# Patient Record
Sex: Female | Born: 1985 | Race: White | Hispanic: No | Marital: Married | Smoking: Never smoker
Health system: Southern US, Community
[De-identification: ages and names within clinical notes are randomized; demographics above are authoritative.]

---

## 2010-05-04 ENCOUNTER — Ambulatory Visit: Payer: Self-pay | Admitting: Internal Medicine

## 2016-08-01 ENCOUNTER — Encounter (HOSPITAL_COMMUNITY): Payer: Self-pay | Admitting: Emergency Medicine

## 2016-08-01 ENCOUNTER — Emergency Department (HOSPITAL_COMMUNITY): Payer: PRIVATE HEALTH INSURANCE

## 2016-08-01 DIAGNOSIS — R0602 Shortness of breath: Secondary | ICD-10-CM | POA: Diagnosis present

## 2016-08-01 DIAGNOSIS — Z79899 Other long term (current) drug therapy: Secondary | ICD-10-CM | POA: Insufficient documentation

## 2016-08-01 DIAGNOSIS — R0600 Dyspnea, unspecified: Secondary | ICD-10-CM | POA: Diagnosis not present

## 2016-08-01 DIAGNOSIS — R079 Chest pain, unspecified: Secondary | ICD-10-CM | POA: Diagnosis not present

## 2016-08-01 LAB — BASIC METABOLIC PANEL
ANION GAP: 10 (ref 5–15)
BUN: 9 mg/dL (ref 6–20)
CHLORIDE: 101 mmol/L (ref 101–111)
CO2: 23 mmol/L (ref 22–32)
Calcium: 9.4 mg/dL (ref 8.9–10.3)
Creatinine, Ser: 0.57 mg/dL (ref 0.44–1.00)
GFR calc Af Amer: >60 mL/min (ref >60)
GFR calc non Af Amer: >60 mL/min (ref >60)
GLUCOSE: 107 mg/dL — AB (ref 65–99)
POTASSIUM: 3.9 mmol/L (ref 3.5–5.1)
Sodium: 134 mmol/L — ABNORMAL LOW (ref 135–145)

## 2016-08-01 LAB — I-STAT TROPONIN, ED: Troponin i, poc: 0.01 ng/mL (ref 0.00–0.08)

## 2016-08-01 LAB — CBC
HEMATOCRIT: 35 % — AB (ref 36.0–46.0)
Hemoglobin: 11.5 g/dL — ABNORMAL LOW (ref 12.0–15.0)
MCH: 28.3 pg (ref 26.0–34.0)
MCHC: 32.9 g/dL (ref 30.0–36.0)
MCV: 86 fL (ref 78.0–100.0)
Platelets: 295 10*3/uL (ref 150–400)
RBC: 4.07 MIL/uL (ref 3.87–5.11)
RDW: 13.1 % (ref 11.5–15.5)
WBC: 8 10*3/uL (ref 4.0–10.5)

## 2016-08-01 NOTE — ED Triage Notes (Signed)
Reports being a little sob with pain in center of the chest for 2 days.  Denies any nausea.  Some loss of appetite.  Reports being able to exercise without difficulty.  Breathing easy and nonlabored at this time with clear lungs noted.

## 2016-08-02 ENCOUNTER — Emergency Department (HOSPITAL_COMMUNITY)
Admission: EM | Admit: 2016-08-02 | Discharge: 2016-08-02 | Disposition: A | Discharge status: Home / Self Care | Payer: PRIVATE HEALTH INSURANCE | Attending: Emergency Medicine | Admitting: Emergency Medicine

## 2016-08-02 DIAGNOSIS — R0600 Dyspnea, unspecified: Secondary | ICD-10-CM

## 2016-08-02 LAB — D-DIMER, QUANTITATIVE (NOT AT ARMC)

## 2016-08-02 MED ORDER — ALBUTEROL SULFATE (2.5 MG/3ML) 0.083% IN NEBU
2.5000 mg | INHALATION_SOLUTION | Freq: Once | RESPIRATORY_TRACT | Status: AC
  Administered 2016-08-02: 2.5 mg via RESPIRATORY_TRACT
  Filled 2016-08-02: qty 3

## 2016-08-02 MED ORDER — AEROCHAMBER PLUS FLO-VU LARGE MISC
Status: AC
  Filled 2016-08-02: qty 1

## 2016-08-02 MED ORDER — AEROCHAMBER PLUS FLO-VU LARGE MISC: 1.0000 | Freq: Once | Status: DC

## 2016-08-02 MED ORDER — ALBUTEROL SULFATE HFA 108 (90 BASE) MCG/ACT IN AERS
2.0000 | INHALATION_SPRAY | RESPIRATORY_TRACT | Status: DC | PRN
  Filled 2016-08-02: qty 6.7

## 2016-08-02 NOTE — Discharge Instructions (Signed)
You have been given an inhaler to use when you feel short of breath 2 puffs every 4-6 hours as needed.  He continued to have symptoms.  Please follow-up with the emergency department or with primary care physician. As discussed.  Tonight, your EKG, lab work and chest x-ray are all within normal parameters.

## 2016-08-02 NOTE — ED Provider Notes (Signed)
[facility]-EMERGENCY DEPT Provider Note   CSN: [id number] Arrival date & time: 08/01/16  2224     History   Chief Complaint Chief Complaint  Patient presents with  . Shortness of Breath    HPI Belinda Hogan is a 31 y.o. female.  This 31 year old female who presents with 2 days of intermittent episodes of shortness of breath and chest discomfort.  She does have a history of seasonal allergies but they're mostly nasal congestion, denies any cough or fever. Patient can not identify a trigger that causes her symptoms. She is concerned because she is on birth control that she Erdman have a pulmonary emboli. Patient does not smoke Perk criteria 1      History reviewed. No pertinent past medical history.  There are no active problems to display for this patient.   History reviewed. No pertinent surgical history.  OB History    No data available       Home Medications    Prior to Admission medications   Medication Sig Start Date End Date Taking? Authorizing Provider  norgestimate-ethinyl estradiol (ORTHO-CYCLEN,SPRINTEC,PREVIFEM) 0.25-35 MG-MCG tablet Take 1 tablet by mouth daily.   Yes [provider]    Family History No family history on file.  Social History Social History  Substance Use Topics  . Smoking status: Never Smoker  . Smokeless tobacco: Never Used  . Alcohol use Yes     Comment: occasionally     Allergies   Patient has no known allergies.   Review of Systems Review of Systems  Constitutional: Negative for fever.  Respiratory: Positive for shortness of breath. Negative for cough.   Cardiovascular: Positive for chest pain. Negative for leg swelling.  All other systems reviewed and are negative.    Physical Exam Updated Vital Signs BP 139/89   Pulse (!) 102   Temp 98.3 F (36.8 C) (Oral)   Resp 14   Ht 5\' 5"  (1.651 m)   Wt 63.5 kg (140 lb)   LMP 07/18/2016 (Approximate)   SpO2 100%   BMI 23.30 kg/m   Physical Exam    Constitutional: She appears well-developed and well-nourished.  Eyes: Pupils are equal, round, and reactive to light.  Cardiovascular: Regular rhythm.  Tachycardia present.   Pulmonary/Chest: Effort normal and breath sounds normal. She has no wheezes.  Abdominal: Soft.  Musculoskeletal: Normal range of motion.  Neurological: She is alert.  Skin: Skin is warm and dry.  Psychiatric: She has a normal mood and affect.  Nursing note and vitals reviewed.    ED Treatments / Results  Labs (all labs ordered are listed, but only abnormal results are displayed) Labs Reviewed  CBC - Abnormal; Notable for the following:       Result Value   Hemoglobin 11.5 (*)    HCT 35.0 (*)    All other components within normal limits  BASIC METABOLIC PANEL - Abnormal; Notable for the following:    Sodium 134 (*)    Glucose, Bld 107 (*)    All other components within normal limits  D-DIMER, QUANTITATIVE (NOT AT [facility])  I-STAT TROPOININ, ED    EKG  EKG Interpretation  Date/Time:  Sunday August 01 2016 22:34:19 EDT Ventricular Rate:  100 PR Interval:  114 QRS Duration: 86 QT Interval:  358 QTC Calculation: 461 R Axis:   81 Text Interpretation:  Normal sinus rhythm Nonspecific ST and T wave abnormality Abnormal ECG Confirmed by Ross MarcusHorton, Courtney ([id number]) on 08/02/2016 12:34:08 AM  Radiology Dg Chest 2 View  Result Date: 08/01/2016 CLINICAL DATA:  Chest pain and shortness of Breath EXAM: CHEST  2 VIEW COMPARISON:  None. FINDINGS: The heart size and mediastinal contours are within normal limits. Both lungs are clear. The visualized skeletal structures are unremarkable. IMPRESSION: No active cardiopulmonary disease. Electronically Signed   By: Alcide Clever M.D.   On: 08/01/2016 23:22    Procedures Procedures (including critical care time)  Medications Ordered in ED Medications  albuterol (PROVENTIL HFA;VENTOLIN HFA) 108 (90 Base) MCG/ACT inhaler 2 puff (not administered)  AEROCHAMBER PLUS  FLO-VU LARGE MISC 1 each (not administered)  albuterol (PROVENTIL) (2.5 MG/3ML) 0.083% nebulizer solution 2.5 mg (2.5 mg Nebulization Given 08/02/16 0052)     Initial Impression / Assessment and Plan / ED Course  I have reviewed the triage vital signs and the nursing notes.  Pertinent labs & imaging results that were available during my care of the patient were reviewed by me and considered in my medical decision making (see chart for details).      Will try albuterol treatment and obtain D Dimer D-dimer is within normal parameters.  Electrolytes and CBC normal.  No sign of anemia or infection.  After albuterol treatment.  Patient states she does feel like she can get a deeper breath.  She'll be discharged home with inhaler that she can use as needed.  She is in the process of obtaining a PCP as she just is new to the area.   Final Clinical Impressions(s) / ED Diagnoses   Final diagnoses:  Dyspnea, unspecified type    New Prescriptions New Prescriptions   No medications on file     Earley Favor, NP 08/02/16 0038    Earley Favor, NP 08/02/16 1610    Shon Baton, MD 08/02/16 [phone]

## 2016-08-02 NOTE — ED Notes (Signed)
Pt given aerochamber, demonstrated appropriate use.

## 2018-04-20 IMAGING — DX DG CHEST 2V
2 series · 2 of 2 positions shown · non-contrast
Comparison: None.

CLINICAL DATA: Chest pain and shortness of Breath

EXAM:
CHEST  2 VIEW

[chest pa]
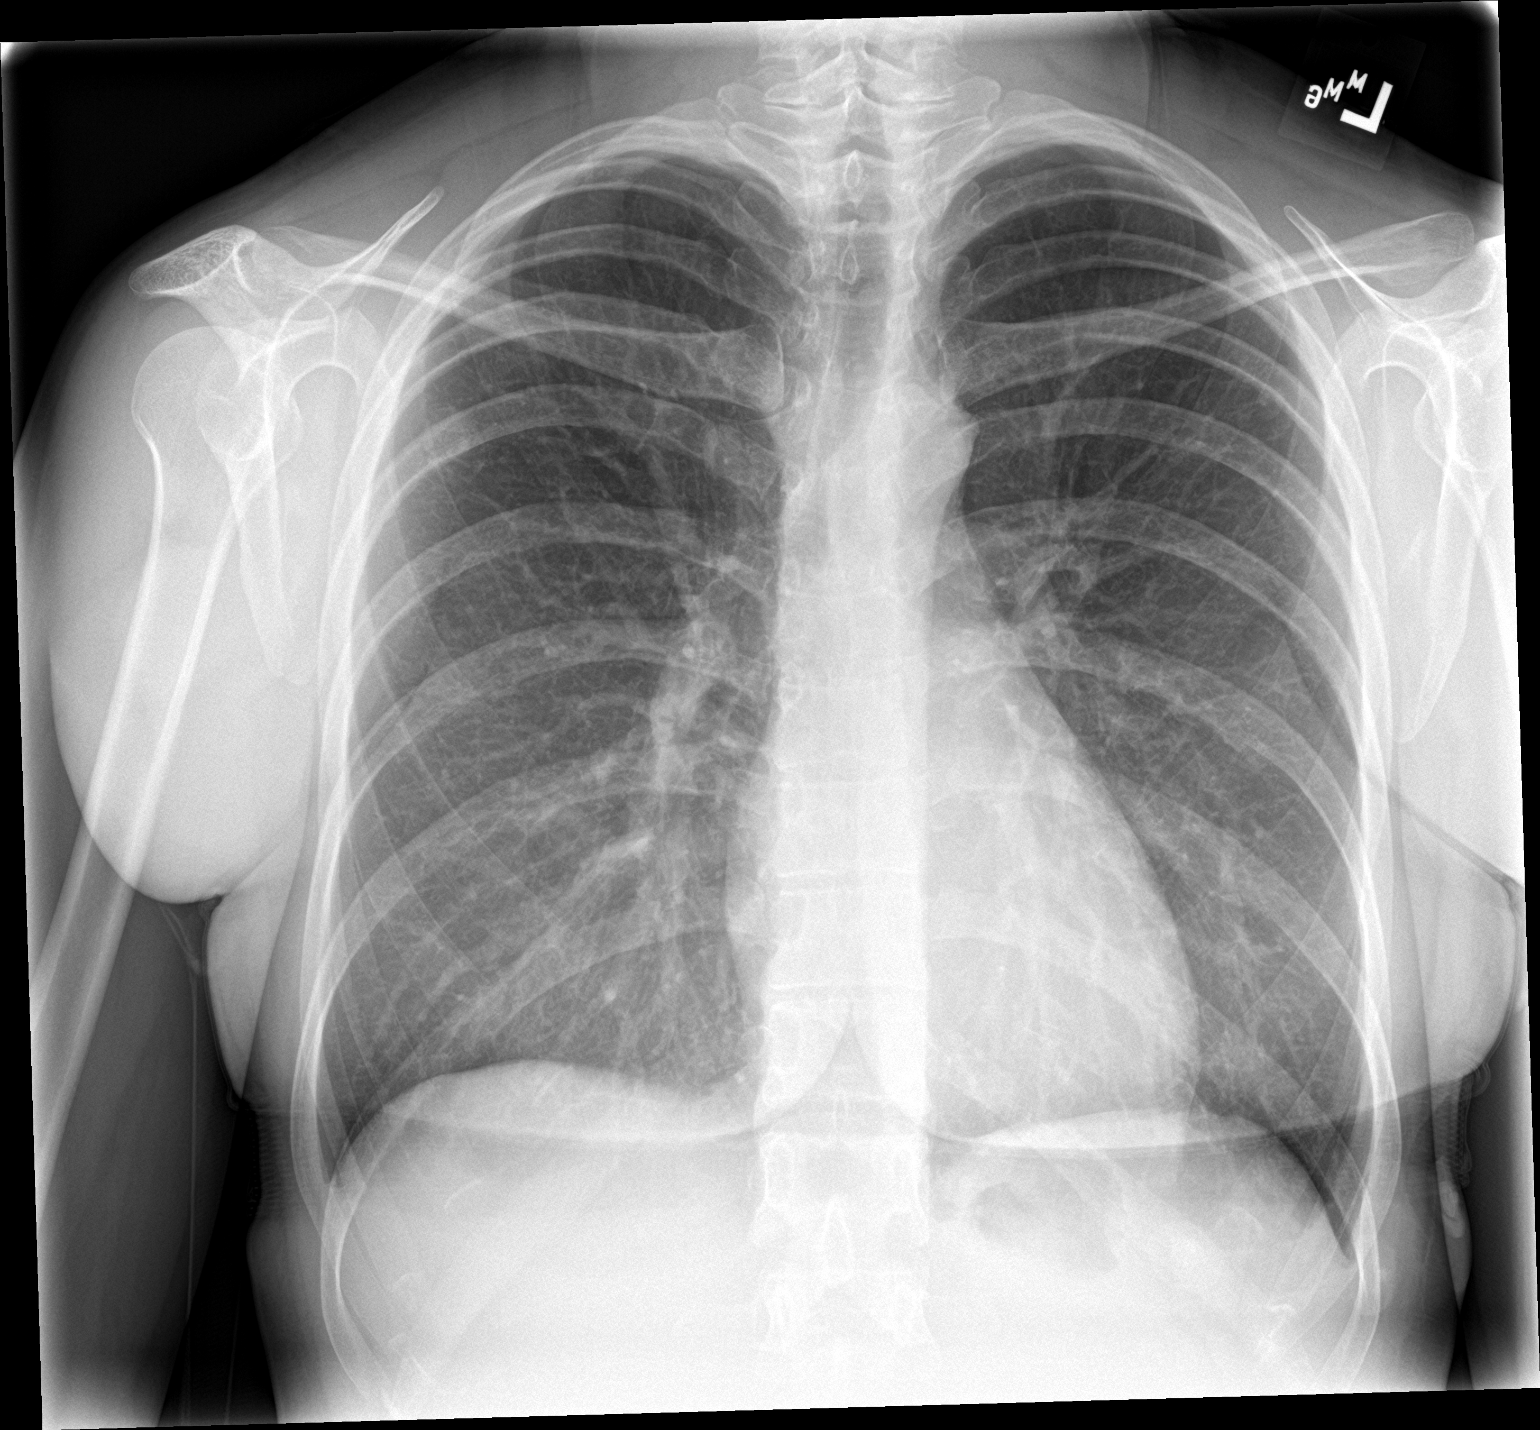

[chest lat]
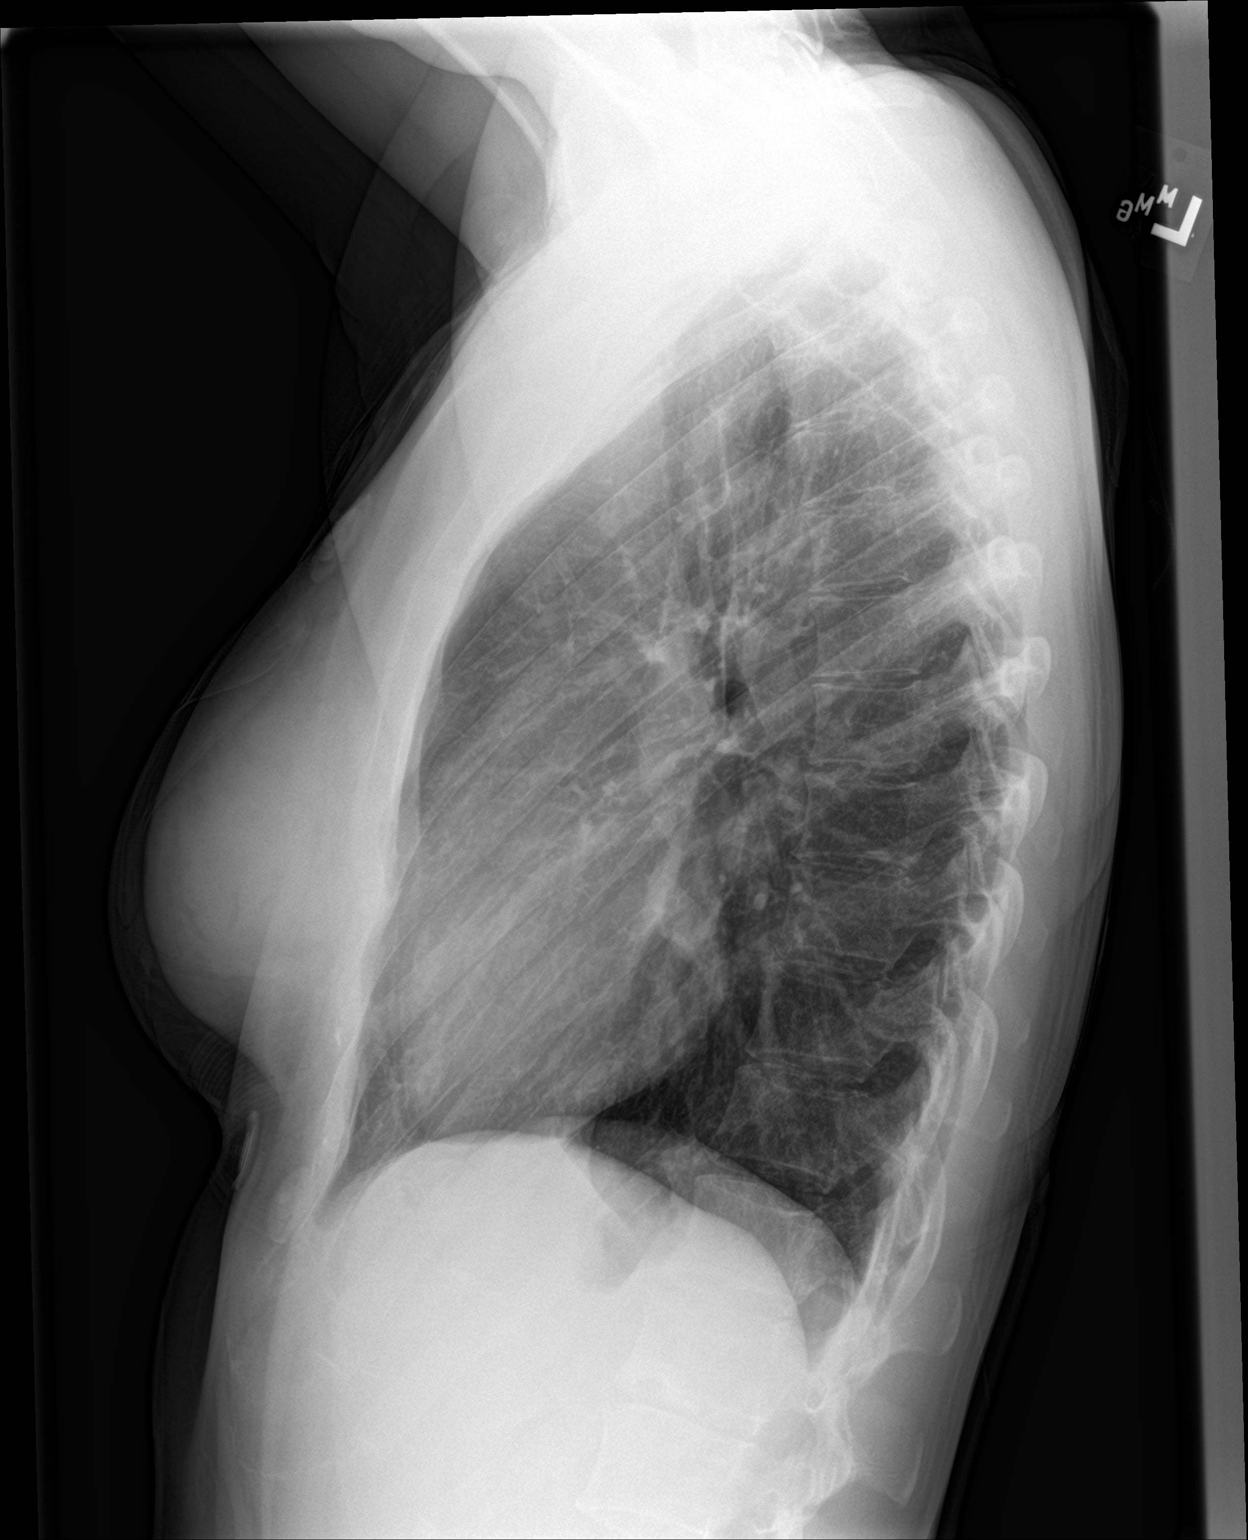

[2 of 2 positions shown; findings below may reference images not displayed]

FINDINGS: The heart size and mediastinal contours are within normal limits.
Both lungs are clear. The visualized skeletal structures are
unremarkable.
IMPRESSION: No active cardiopulmonary disease.
